# Patient Record
Sex: Male | Born: 1975 | Race: Black or African American | Hispanic: No | Marital: Single | Smoking: Current every day smoker
Health system: Southern US, Community
[De-identification: ages and names within clinical notes are randomized; demographics above are authoritative.]

## PROBLEM LIST (undated history)

## (undated) DIAGNOSIS — I2510 Atherosclerotic heart disease of native coronary artery without angina pectoris: Secondary | ICD-10-CM

## (undated) DIAGNOSIS — I219 Acute myocardial infarction, unspecified: Secondary | ICD-10-CM

---

## 2017-07-03 ENCOUNTER — Emergency Department (HOSPITAL_BASED_OUTPATIENT_CLINIC_OR_DEPARTMENT_OTHER)
Admission: EM | Admit: 2017-07-03 | Discharge: 2017-07-03 | Disposition: A | Discharge status: Home / Self Care | Payer: Federal, State, Local not specified - PPO | Attending: Emergency Medicine | Admitting: Emergency Medicine

## 2017-07-03 ENCOUNTER — Emergency Department (HOSPITAL_BASED_OUTPATIENT_CLINIC_OR_DEPARTMENT_OTHER): Payer: Federal, State, Local not specified - PPO

## 2017-07-03 ENCOUNTER — Encounter (HOSPITAL_BASED_OUTPATIENT_CLINIC_OR_DEPARTMENT_OTHER): Payer: Self-pay | Admitting: Emergency Medicine

## 2017-07-03 DIAGNOSIS — I252 Old myocardial infarction: Secondary | ICD-10-CM | POA: Diagnosis not present

## 2017-07-03 DIAGNOSIS — Z79899 Other long term (current) drug therapy: Secondary | ICD-10-CM | POA: Diagnosis not present

## 2017-07-03 DIAGNOSIS — I2510 Atherosclerotic heart disease of native coronary artery without angina pectoris: Secondary | ICD-10-CM | POA: Insufficient documentation

## 2017-07-03 DIAGNOSIS — Z7982 Long term (current) use of aspirin: Secondary | ICD-10-CM | POA: Diagnosis not present

## 2017-07-03 DIAGNOSIS — R079 Chest pain, unspecified: Secondary | ICD-10-CM | POA: Insufficient documentation

## 2017-07-03 DIAGNOSIS — F17200 Nicotine dependence, unspecified, uncomplicated: Secondary | ICD-10-CM | POA: Insufficient documentation

## 2017-07-03 HISTORY — DX: Acute myocardial infarction, unspecified: I21.9

## 2017-07-03 HISTORY — DX: Atherosclerotic heart disease of native coronary artery without angina pectoris: I25.10

## 2017-07-03 LAB — BASIC METABOLIC PANEL
Anion gap: 6 (ref 5–15)
BUN: 13 mg/dL (ref 6–20)
CHLORIDE: 107 mmol/L (ref 101–111)
CO2: 24 mmol/L (ref 22–32)
CREATININE: 0.93 mg/dL (ref 0.61–1.24)
Calcium: 8.9 mg/dL (ref 8.9–10.3)
GFR calc Af Amer: >60 mL/min (ref >60)
GFR calc non Af Amer: >60 mL/min (ref >60)
Glucose, Bld: 108 mg/dL — ABNORMAL HIGH (ref 65–99)
Potassium: 4.1 mmol/L (ref 3.5–5.1)
Sodium: 137 mmol/L (ref 135–145)

## 2017-07-03 LAB — TROPONIN I
Troponin I: <0.03 ng/mL (ref <0.03)
Troponin I: <0.03 ng/mL (ref <0.03)

## 2017-07-03 LAB — CBC
HCT: 41.5 % (ref 39.0–52.0)
Hemoglobin: 13.9 g/dL (ref 13.0–17.0)
MCH: 29.2 pg (ref 26.0–34.0)
MCHC: 33.5 g/dL (ref 30.0–36.0)
MCV: 87.2 fL (ref 78.0–100.0)
PLATELETS: 219 10*3/uL (ref 150–400)
RBC: 4.76 MIL/uL (ref 4.22–5.81)
RDW: 15.1 % (ref 11.5–15.5)
WBC: 10.9 10*3/uL — ABNORMAL HIGH (ref 4.0–10.5)

## 2017-07-03 MED ORDER — GI COCKTAIL ~~LOC~~
30.0000 mL | Freq: Once | ORAL | Status: AC
  Administered 2017-07-03: 30 mL via ORAL
  Filled 2017-07-03: qty 30

## 2017-07-03 NOTE — ED Triage Notes (Signed)
Patient states that he had a heart attack about 2 months ago in [place]  - the patient had a stent. He woke up this am with a similar feeling in his chest and went to the [facility]. They did an EKG and it was normal but felt like he needed further evaluation  - patient states that he had a biscut this am which he feels caused him to have reflux

## 2017-07-03 NOTE — Discharge Instructions (Signed)
No signs of heart attack today. Please call and follow up with your cardiologist as soon as possible. Return if worsening symptoms.

## 2017-07-03 NOTE — ED Notes (Signed)
ED Provider at bedside. 

## 2017-07-03 NOTE — ED Provider Notes (Signed)
[facility]   CSN: [id number] Arrival date & time: 07/03/17  1030     History   Chief Complaint Chief Complaint  Patient presents with  . Chest Pain    HPI Frederick Kent is a 41 y.o. male.  HPI Frederick Kent is a 41 y.o. 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 CP began today. States he believes it is indigestion, after eating a spicy busquit from a gas station. Went to UC, sent here for further evaluation. Tried 1 nitro glycerine which did not help. Reports associated  Belching and burping. Similar symptoms when had NSTEMI.    Past Medical History:  Diagnosis Date  . Coronary artery disease   . Myocardial infarct (HCC)     There are no active problems to display for this patient.   History reviewed. No pertinent surgical history.     Home Medications    Prior to Admission medications   Medication Sig Start Date End Date Taking? Authorizing Provider  aspirin 81 MG chewable tablet Chew by mouth daily.   Yes [provider]  ticagrelor (BRILINTA) 60 MG TABS tablet Take by mouth 2 (two) times daily.   Yes [provider]    Family History History reviewed. No pertinent family history.  Social History Social History  Substance Use Topics  . Smoking status: Current Every Day Smoker  . Smokeless tobacco: Never Used  . Alcohol use No     Allergies   Patient has no known allergies.   Review of Systems Review of Systems  Constitutional: Negative for chills and fever.  Respiratory: Positive for chest tightness. Negative for cough and shortness of breath.   Cardiovascular: Positive for chest pain. Negative for palpitations and leg swelling.  Gastrointestinal: Negative for abdominal distention, abdominal pain, diarrhea, nausea and vomiting.  Genitourinary: Negative for dysuria, frequency, hematuria and urgency.    Musculoskeletal: Negative for arthralgias, myalgias, neck pain and neck stiffness.  Skin: Negative for rash.  Allergic/Immunologic: Negative for immunocompromised state.  Neurological: Negative for dizziness, weakness, light-headedness, numbness and headaches.  All other systems reviewed and are negative.    Physical Exam Updated Vital Signs BP 134/77 (BP Location: Left Arm)   Pulse 84   Temp 98.2 F (36.8 C) (Oral)   Resp 18   Ht  (1.854 m)   Wt (!) 158.8 kg (350 lb)   SpO2 98%   BMI 46.18 kg/m   Physical Exam  Constitutional: He is oriented to person, place, and time. He appears well-developed and well-nourished. No distress.  HENT:  Head: Normocephalic and atraumatic.  Eyes: Conjunctivae are normal.  Neck: Neck supple.  Cardiovascular: Normal rate, regular rhythm and normal heart sounds.   Pulmonary/Chest: Effort normal. No respiratory distress. He has no wheezes. He has no rales. He exhibits no tenderness.  Abdominal: Soft. Bowel sounds are normal. He exhibits no distension. There is no tenderness. There is no rebound.  Musculoskeletal: He exhibits no edema.  Neurological: He is alert and oriented to person, place, and time.  Skin: Skin is warm and dry.  Nursing note and vitals reviewed.    ED Treatments / Results  Labs (all labs ordered are listed, but only abnormal results are displayed) Labs Reviewed  BASIC METABOLIC PANEL - Abnormal; Notable for the following:       Result Value   Glucose, Bld 108 (*)  All other components within normal limits  CBC - Abnormal; Notable for the following:    WBC 10.9 (*)    All other components within normal limits  TROPONIN I  TROPONIN I    EKG  EKG Interpretation  Date/Time:  Tuesday July 03 2017 10:58:02 EDT Ventricular Rate:  67 PR Interval:    QRS Duration: 90 QT Interval:  390 QTC Calculation: 412 R Axis:   41 Text Interpretation:  Sinus rhythm Consider left atrial enlargement Low voltage,  precordial leads No previous ECGs available Confirmed by Vanetta Mulders [phone]) on 07/03/2017 11:57:46 AM       Radiology Dg Chest 2 View  Result Date: 07/03/2017 CLINICAL DATA:  41 year old male with a history of chest pain EXAM: CHEST  2 VIEW COMPARISON:  None. FINDINGS: The heart size and mediastinal contours are within normal limits. Both lungs are clear. The visualized skeletal structures are unremarkable. IMPRESSION: No radiographic evidence of acute cardiopulmonary disease Electronically Signed   By: Gilmer Mor D.O.   On: 07/03/2017 11:29    Procedures Procedures (including critical care time)  Medications Ordered in ED Medications - No data to display   Initial Impression / Assessment and Plan / ED Course  I have reviewed the triage vital signs and the nursing notes.  Pertinent labs & imaging results that were available during my care of the patient were reviewed by me and considered in my medical decision making (see chart for details).     Pt with nstemi 3 months ago, here with recurrent chest pain. ECG normal. Will check labs, CXR, monitor.   12:05 PM pt currently CP free. Negative first trop. Discussed with Dr. Deretha Emory, will monitor for 3 more hours, 6 hrs from onset of CP, and recheck trop  3:37 PM patient felt the troponin is negative. This is a 6 hour marker. He continues to be chest pain-free. He is from [place] and will go back tomorrow. He would like to go home. Advised to call his cardiologist, informed them of this pain and visit. We discussed healthier diet choices. will have patient follow-up with his doctor soon as able.  Vitals:   07/03/17 1036 07/03/17 1100 07/03/17 1312  BP: 134/77 128/78 115/74  Pulse: 84 69 65  Resp: Temp: 98.2 F (36.8 C)    TempSrc: Oral    SpO2: 98% 95% 98%  Weight: (!) 158.8 kg (350 lb)    Height:  (1.854 m)        Final Clinical Impressions(s) / ED Diagnoses   Final diagnoses:  Chest pain,  unspecified type    New Prescriptions New Prescriptions   No medications on file     Jaynie Crumble, PA-C 07/03/17 1539    Vanetta Mulders, MD 07/06/17 2328

## 2019-04-06 IMAGING — CR DG CHEST 2V
2 series · 2 of 2 positions shown · non-contrast
Comparison: None.

CLINICAL DATA: 41-year-old male with a history of chest pain

EXAM:
CHEST  2 VIEW

[w chest pa *]
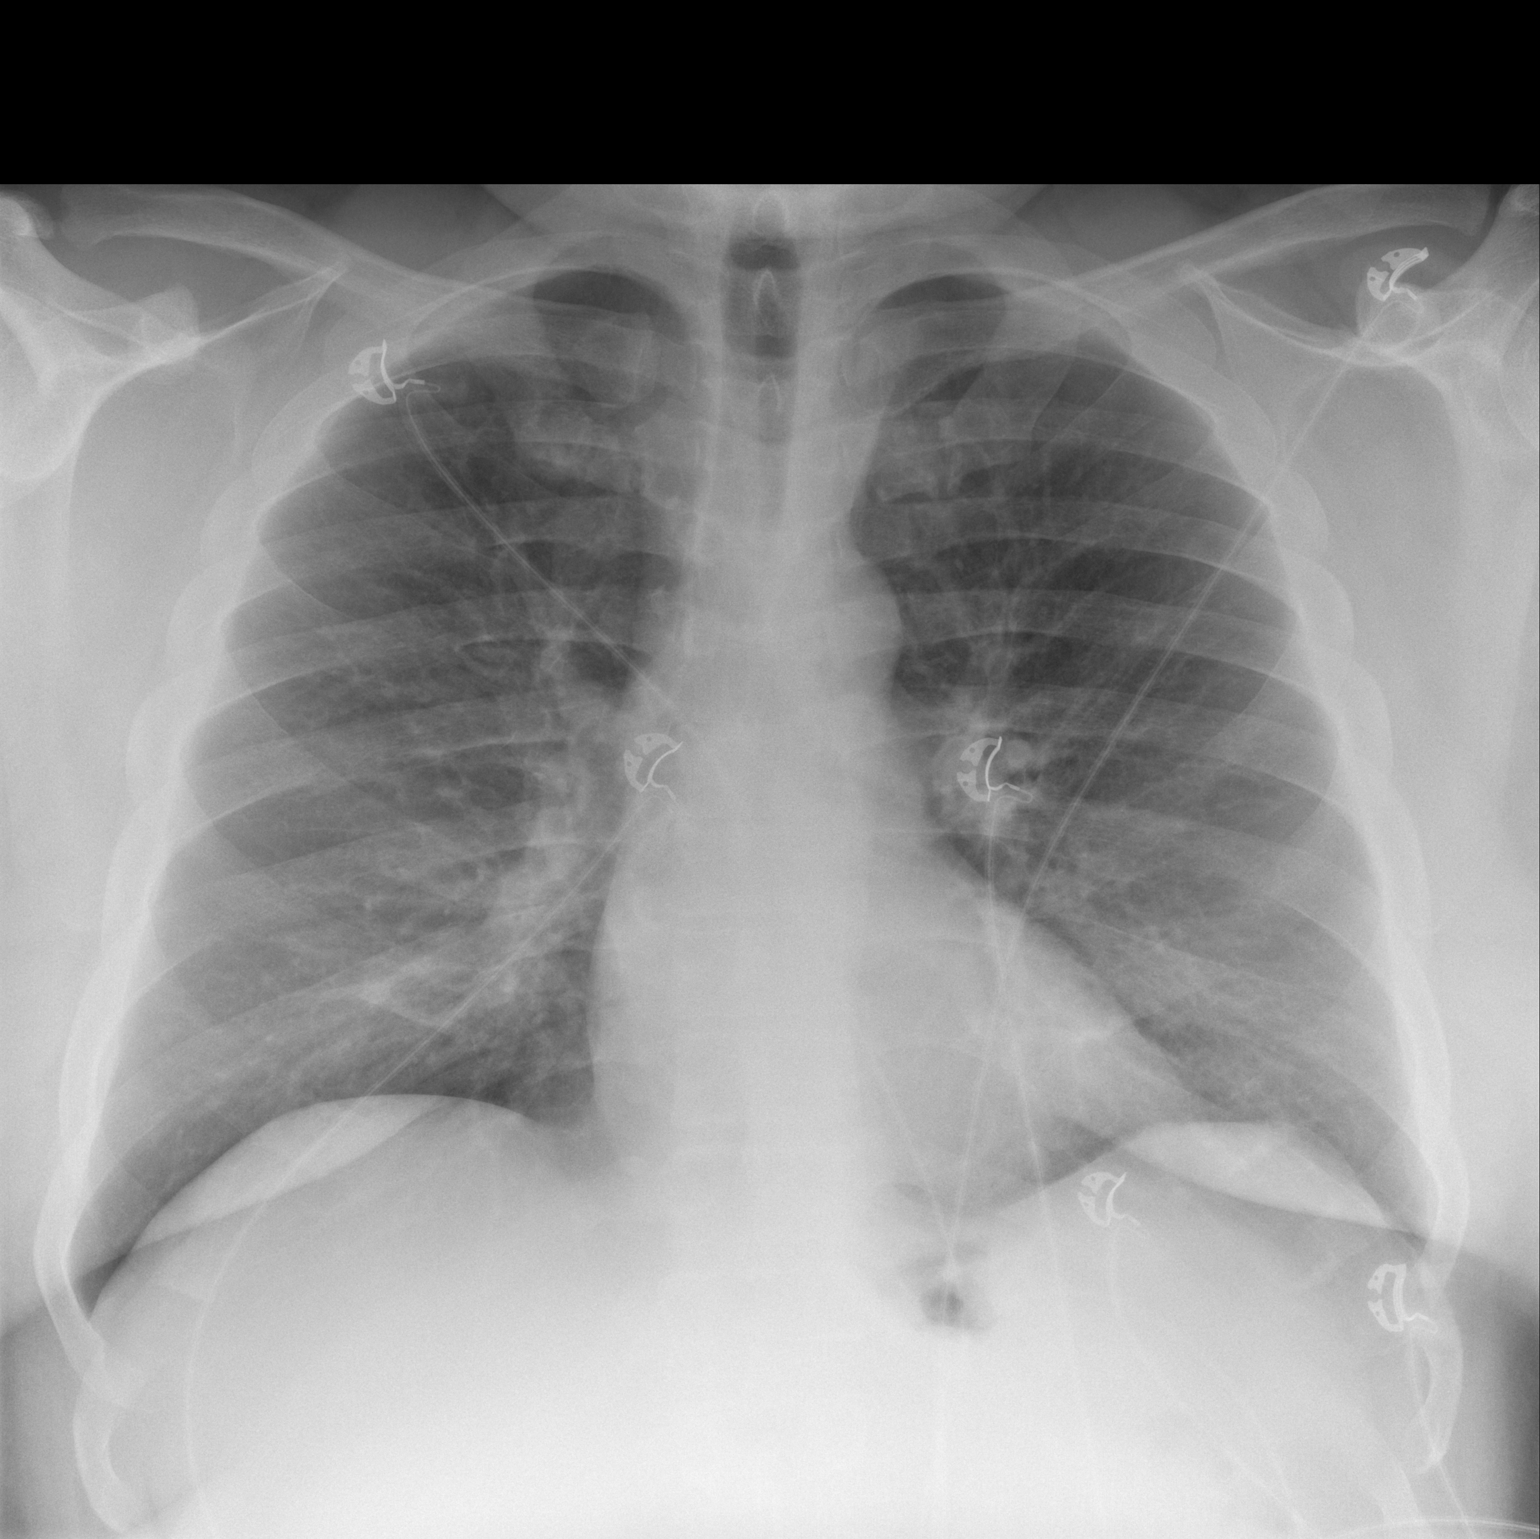

[w chest lat *]
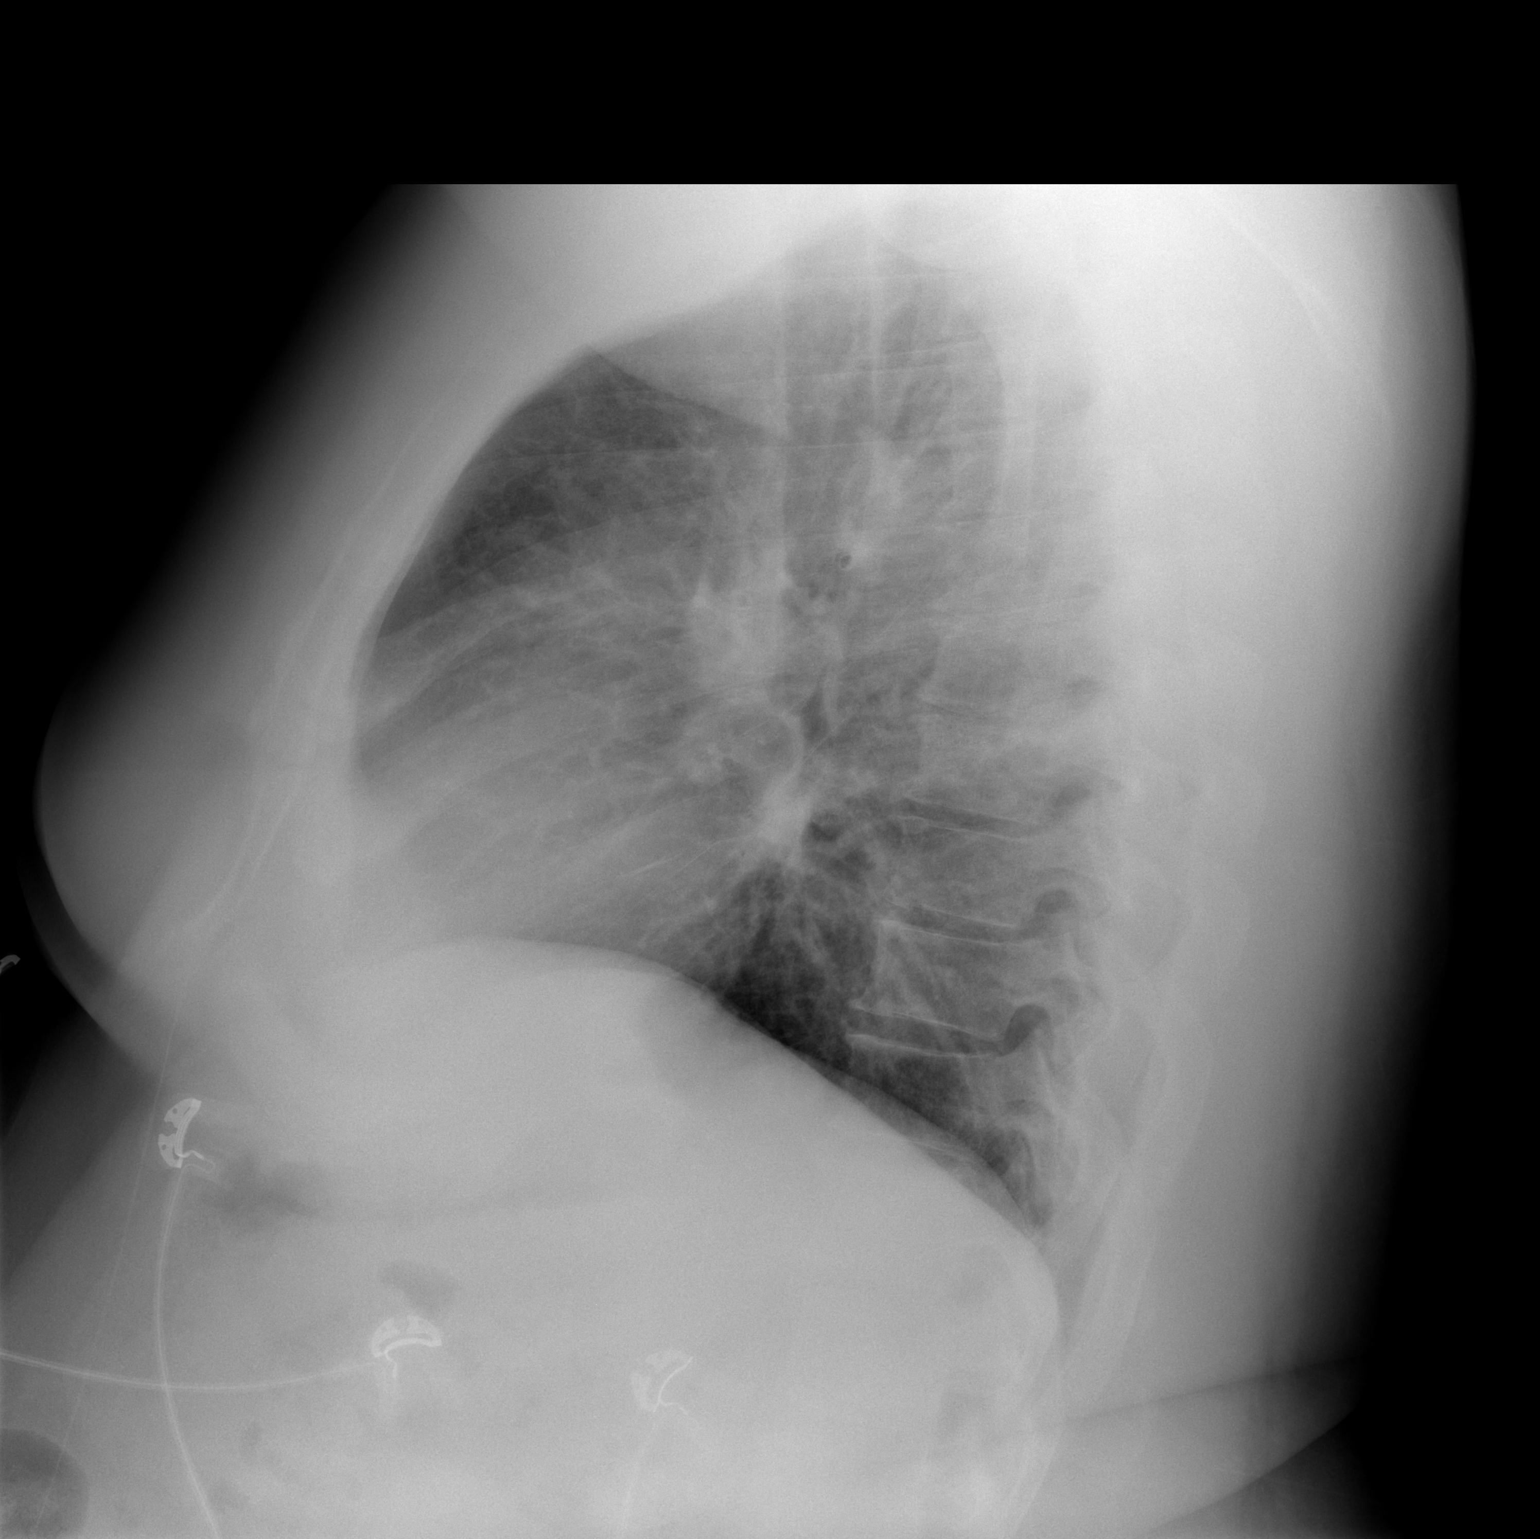

[2 of 2 positions shown; findings below may reference images not displayed]

FINDINGS: The heart size and mediastinal contours are within normal limits.
Both lungs are clear. The visualized skeletal structures are
unremarkable.
IMPRESSION: No radiographic evidence of acute cardiopulmonary disease

## 2024-01-24 DEATH — deceased
